# Patient Record
Sex: Female | Born: 1980 | Hispanic: Yes | Marital: Married | Smoking: Never smoker
Health system: Southern US, Community
[De-identification: ages and names within clinical notes are randomized; demographics above are authoritative.]

## PROBLEM LIST (undated history)

## (undated) HISTORY — PX: TUBAL LIGATION: SHX77

## (undated) HISTORY — PX: AUGMENTATION MAMMAPLASTY: SUR837

## (undated) HISTORY — PX: BREAST SURGERY: SHX581

---

## 2020-02-07 ENCOUNTER — Emergency Department (HOSPITAL_COMMUNITY): Payer: Self-pay

## 2020-02-07 ENCOUNTER — Other Ambulatory Visit: Payer: Self-pay

## 2020-02-07 ENCOUNTER — Emergency Department (HOSPITAL_COMMUNITY)
Admission: EM | Admit: 2020-02-07 | Discharge: 2020-02-07 | Disposition: A | Discharge status: Home / Self Care | Payer: Self-pay | Attending: Emergency Medicine | Admitting: Emergency Medicine

## 2020-02-07 DIAGNOSIS — R1084 Generalized abdominal pain: Secondary | ICD-10-CM

## 2020-02-07 DIAGNOSIS — R10817 Generalized abdominal tenderness: Secondary | ICD-10-CM | POA: Insufficient documentation

## 2020-02-07 DIAGNOSIS — M545 Low back pain, unspecified: Secondary | ICD-10-CM | POA: Insufficient documentation

## 2020-02-07 DIAGNOSIS — Z20822 Contact with and (suspected) exposure to covid-19: Secondary | ICD-10-CM | POA: Insufficient documentation

## 2020-02-07 LAB — URINALYSIS, ROUTINE W REFLEX MICROSCOPIC
Bilirubin Urine: NEGATIVE
Glucose, UA: NEGATIVE mg/dL
Hgb urine dipstick: NEGATIVE
Ketones, ur: NEGATIVE mg/dL
Leukocytes,Ua: NEGATIVE
Nitrite: NEGATIVE
Protein, ur: NEGATIVE mg/dL
Specific Gravity, Urine: 1.012 (ref 1.005–1.030)
pH: 6 (ref 5.0–8.0)

## 2020-02-07 LAB — CBC
HCT: 39.5 % (ref 36.0–46.0)
Hemoglobin: 13.1 g/dL (ref 12.0–15.0)
MCH: 32 pg (ref 26.0–34.0)
MCHC: 33.2 g/dL (ref 30.0–36.0)
MCV: 96.3 fL (ref 80.0–100.0)
Platelets: 227 10*3/uL (ref 150–400)
RBC: 4.1 MIL/uL (ref 3.87–5.11)
RDW: 11.4 % — ABNORMAL LOW (ref 11.5–15.5)
WBC: 3.2 10*3/uL — ABNORMAL LOW (ref 4.0–10.5)
nRBC: 0 % (ref 0.0–0.2)

## 2020-02-07 LAB — COMPREHENSIVE METABOLIC PANEL
ALT: 33 U/L (ref 0–44)
AST: 25 U/L (ref 15–41)
Albumin: 3.9 g/dL (ref 3.5–5.0)
Alkaline Phosphatase: 60 U/L (ref 38–126)
Anion gap: 10 (ref 5–15)
BUN: 11 mg/dL (ref 6–20)
CO2: 24 mmol/L (ref 22–32)
Calcium: 9.2 mg/dL (ref 8.9–10.3)
Chloride: 102 mmol/L (ref 98–111)
Creatinine, Ser: 0.55 mg/dL (ref 0.44–1.00)
GFR, Estimated: >60 mL/min (ref >60)
Glucose, Bld: 92 mg/dL (ref 70–99)
Potassium: 3.6 mmol/L (ref 3.5–5.1)
Sodium: 136 mmol/L (ref 135–145)
Total Bilirubin: 0.7 mg/dL (ref 0.3–1.2)
Total Protein: 7.1 g/dL (ref 6.5–8.1)

## 2020-02-07 LAB — I-STAT BETA HCG BLOOD, ED (MC, WL, AP ONLY): I-stat hCG, quantitative: <5 m[IU]/mL (ref <5)

## 2020-02-07 LAB — LIPASE, BLOOD: Lipase: 30 U/L (ref 11–51)

## 2020-02-07 MED ORDER — HYDROCODONE-ACETAMINOPHEN 5-325 MG PO TABS: 1.0000 | ORAL_TABLET | Freq: Four times a day (QID) | ORAL | 0 refills | Status: AC | PRN

## 2020-02-07 MED ORDER — IOHEXOL 300 MG/ML  SOLN
100.0000 mL | Freq: Once | INTRAMUSCULAR | Status: AC | PRN
  Administered 2020-02-07: 100 mL via INTRAVENOUS

## 2020-02-07 MED ORDER — KETOROLAC TROMETHAMINE 30 MG/ML IJ SOLN
30.0000 mg | Freq: Once | INTRAMUSCULAR | Status: AC
  Administered 2020-02-07: 30 mg via INTRAVENOUS
  Filled 2020-02-07: qty 1

## 2020-02-07 NOTE — ED Notes (Signed)
Xray waiting on COVID swab to come back before patient is transported to xray.

## 2020-02-07 NOTE — Discharge Instructions (Signed)
As we discussed, your lab work were reassuring.  Your CT scan showed some thickening of your uterus that could be consistent with a uterine fibroid. We will plan to have you follow-up with the outpatient OB/GYN doctors for further evaluation. This also may require a ultrasound for further management and description of these findings.  You can take Tylenol or Ibuprofen as directed for pain. You can alternate Tylenol and Ibuprofen every 4 hours. If you take Tylenol at 1pm, then you can take Ibuprofen at 5pm. Then you can take Tylenol again at 9pm.   Take pain medications as directed for break through pain. Do not drive or operate machinery while taking this medication.   Return to the emergency department for any fever, nausea/vomiting, worsening pain or any other worsening concerning symptoms.

## 2020-02-07 NOTE — ED Notes (Signed)
Pt to CT

## 2020-02-07 NOTE — ED Provider Notes (Signed)
[facility] EMERGENCY DEPARTMENT Provider Note   CSN: [id number] Arrival date & time: 02/07/20  0809     History Chief Complaint  Patient presents with   Abdominal Pain    Brandi Hurst is a 40 y.o. female who presents for evaluation of abdominal pain.  Patient reports that she has had left-sided abdominal pain for several months.  She states that she saw one clinic for evaluation of pain several months ago and was told to follow-up but she never did.  Patient states that she has continued to have abdominal pain.  She states that the abdominal pain is centrally located.  She states that sometimes it will hurt and be very sharp and other times it will be dull.  She states it comes randomly.  She states that there are certain foods to eat such as green vegetables, plan teens, Milks that does make it worse.  She has been able to eat and drink other foods without any difficulty.  She has not any associated nausea/vomiting.  She has not had any dysuria, hematuria.  She reports occasionally, she will get pain in her back as well.  She feels like sometimes the pain gets so severe it makes the pain in her back and leg.s  She states that the pain is worse when she bends down or moves. This does not happen all the time. She does report both the stomach pain and the back pain are worse around her menstrual cycle.   She has not taken any medication for this pain.  She states that about 3 months time, she felt like she has lost about 15 pounds.  She has not had any fevers or night sweats.  She denies any chest pain, difficulty breathing, numbness/weakness of her arms or legs.  The history is provided by the patient. A language interpreter was used.       No past medical history on file.  There are no problems to display for this patient.    The histories are not reviewed yet. Please review them in the "History" navigator section and refresh this [place].   OB History   No obstetric  history on file.     No family history on file.     Home Medications Prior to Admission medications   Medication Sig Start Date End Date Taking? Authorizing Provider  HYDROcodone-acetaminophen (NORCO/VICODIN) 5-325 MG tablet Take 1-2 tablets by mouth every 6 (six) hours as needed. 02/07/20  Yes Volanda Napoleon, PA-C    Allergies    Patient has no known allergies.  Review of Systems   Review of Systems  Constitutional: Negative for fever.  Respiratory: Negative for cough and shortness of breath.   Cardiovascular: Negative for chest pain.  Gastrointestinal: Positive for abdominal pain. Negative for nausea and vomiting.  Genitourinary: Negative for dysuria and hematuria.  Musculoskeletal: Positive for back pain.  Neurological: Negative for headaches.  All other systems reviewed and are negative.   Physical Exam Updated Vital Signs BP 113/69    Pulse (!) 59    Temp 98.2 F (36.8 C)    Resp 13    LMP 01/26/2020    SpO2 100%   Physical Exam Vitals and nursing note reviewed.  Constitutional:      Appearance: Normal appearance. She is well-developed and well-nourished.     Comments: Sitting comfortably on examination table  HENT:     Head: Normocephalic and atraumatic.     Mouth/Throat:     Mouth:  Oropharynx is clear and moist and mucous membranes are normal.  Eyes:     General: Lids are normal.     Extraocular Movements: EOM normal.     Conjunctiva/sclera: Conjunctivae normal.     Pupils: Pupils are equal, round, and reactive to light.  Cardiovascular:     Rate and Rhythm: Normal rate and regular rhythm.     Pulses: Normal pulses.     Heart sounds: Normal heart sounds. No murmur heard. No friction rub. No gallop.   Pulmonary:     Effort: Pulmonary effort is normal.     Breath sounds: Normal breath sounds.     Comments: Lungs clear to auscultation bilaterally.  Symmetric chest rise.  No wheezing, rales, rhonchi. Abdominal:     Palpations: Abdomen is soft. Abdomen is  not rigid.     Tenderness: There is generalized abdominal tenderness. There is no guarding.       Comments: Abdomen soft, nondistended.  Generalized tenderness noted centrally.  No rigidity, guarding.  Musculoskeletal:        General: Normal range of motion.     Cervical back: Full passive range of motion without pain.     Comments: Tenderness palpation in midline T-spine.  No deformity or step-off noted.  Skin:    General: Skin is warm and dry.     Capillary Refill: Capillary refill takes less than 2 seconds.  Neurological:     Mental Status: She is alert and oriented to person, place, and time.     Comments: Follows commands, Moves all extremities  5/5 strength to BUE and BLE  Sensation intact throughout all major nerve distributions  Psychiatric:        Mood and Affect: Mood and affect normal.        Speech: Speech normal.     ED Results / Procedures / Treatments   Labs (all labs ordered are listed, but only abnormal results are displayed) Labs Reviewed  CBC - Abnormal; Notable for the following components:      Result Value   WBC 3.2 (*)    RDW 11.4 (*)    All other components within normal limits  URINALYSIS, ROUTINE W REFLEX MICROSCOPIC - Abnormal; Notable for the following components:   APPearance HAZY (*)    All other components within normal limits  SARS CORONAVIRUS 2 (TAT 6-24 HRS)  LIPASE, BLOOD  COMPREHENSIVE METABOLIC PANEL  I-STAT BETA HCG BLOOD, ED ([facility], [facility], [facility] ONLY)    EKG None  Radiology CT ABDOMEN PELVIS W CONTRAST  Result Date: 02/07/2020 CLINICAL DATA:  Left-sided abdominal pain for 3 days. EXAM: CT ABDOMEN AND PELVIS WITH CONTRAST TECHNIQUE: Multidetector CT imaging of the abdomen and pelvis was performed using the standard protocol following bolus administration of intravenous contrast. CONTRAST:  122mL OMNIPAQUE IOHEXOL 300 MG/ML  SOLN COMPARISON:  None. FINDINGS: Lower Chest: No acute findings. Hepatobiliary: No hepatic masses identified.  Gallbladder is unremarkable. No evidence of biliary ductal dilatation. Pancreas:  No mass or inflammatory changes. Spleen: Within normal limits in size and appearance. Adrenals/Urinary Tract: No masses identified. No evidence of ureteral calculi or hydronephrosis. Stomach/Bowel: No evidence of obstruction, inflammatory process or abnormal fluid collections. Vascular/Lymphatic: No pathologically enlarged lymph nodes. No abdominal aortic aneurysm. Reproductive: Uterus with is retroflexed and mildly enlarged, with heterogeneous enhancement. This may be due to adenomyosis or uterine fibroid. Adnexal regions are unremarkable. Other:  None. Musculoskeletal:  No suspicious bone lesions identified. IMPRESSION: No acute findings. Mildly enlarged and heterogeneous enhancement of  uterus, which may be due to adenomyosis or fibroid. Pelvic ultrasound could be performed for further evaluation if clinically warranted. Electronically Signed   By: Marlaine Hind M.D.   On: 02/07/2020 19:48    Procedures Procedures (including critical care time)  Medications Ordered in ED Medications  iohexol (OMNIPAQUE) 300 MG/ML solution 100 mL (100 mLs Intravenous Contrast Given 02/07/20 1934)  ketorolac (TORADOL) 30 MG/ML injection 30 mg (30 mg Intravenous Given 02/07/20 2123)    ED Course  I have reviewed the triage vital signs and the nursing notes.  Pertinent labs & imaging results that were available during my care of the patient were reviewed by me and considered in my medical decision making (see chart for details).    MDM Rules/Calculators/A&P                          40 year old female who presents for evaluation of generalized abdominal pain. She reports this has been ongoing for several months. She sought evaluation for it and was referred but states that she never followed up with them. She comes in today because she continues to have pain. She also reports that occasionally, she will get pain in her back. She states  that sometimes the pain is worse when she bends over. She also reports certain foods such as milk, plan teens, green vegetables cause the pain to get worse. No fevers, nausea/vomiting. She does think she is lost about 15 pounds in the last several months. On initial arrival, she is afebrile, nontoxic-appearing. Vital signs are stable. On exam, she has generalized central abdominal tenderness. No rigidity, guarding. She also has some point midline tenderness and into the mid thoracic spine. Low suspicion for infectious process such as appendicitis, diverticulitis given that this is been ongoing. Additionally, history/physical exam exerting for ovarian torsion, PID. Low suspicion for GU etiology, hepatobiliary etiology. Plan to check labs. Given that this has been ongoing she does report some weight loss, will obtain imaging.  UA shows no infectious etiology. Lipase normal. CBC shows slight leukopenia at 3.2. I-STAT beta is negative. CMP is unremarkable.  CT abdomen shows no evidence of acute findings. There is mildly enlarged and heterogenous enhancement of the uterus which may be due to adenomyosis and fibroid. She will need a pelvic ultrasound on an outpatient basis. Adnexa unremarkable.   Reevaluation. Patient resting comfortably. Repeat abdominal exam is benign. She reports improvement in tenderness and pain after Toradol here in the department.  I discussed with her regarding her findings on CT scan. I discussed with her that her findings could be consistent with a fibroid vs adenomysosis. She does tell me that she has had heavy menstrual cycles. Patient does not have an OB/GYN she follows up. We will plan to give her outpatient referrals to get an outpatient ultrasound. Encourage at home supportive care measures. I did discuss with patient that she has a COVID test pending given leukopenia as well as consultation of symptoms. I also discussed with patient she had an x-ray of her T-spine pending given  that she had some tenderness noted. Patient states she does not wish to stay for the x-ray and would like to go home. I feel this is reasonable. Patient given short course of pain medication for severe breakthrough pain. Encouraged at home supportive care measures and encouraged follow-up with outpatient OB/GYN as directed. At this time, patient exhibits no emergent life-threatening condition that require further evaluation in ED. Patient had ample  opportunity for questions and discussion. All patient's questions were answered with full understanding. Strict return precautions discussed. Patient expresses understanding and agreement to plan.   Erleen Egner was evaluated in Emergency Department on 02/08/2020 for the symptoms described in the history of present illness. She was evaluated in the context of the global COVID-19 pandemic, which necessitated consideration that the patient might be at risk for infection with the SARS-CoV-2 virus that causes COVID-19. Institutional protocols and algorithms that pertain to the evaluation of patients at risk for COVID-19 are in a state of rapid change based on information released by regulatory bodies including the CDC and federal and state organizations. These policies and algorithms were followed during the patient's care in the ED.  Portions of this note were generated with Lobbyist. Dictation errors may occur despite best attempts at proofreading.    Final Clinical Impression(s) / ED Diagnoses Final diagnoses:  Generalized abdominal pain    Rx / DC Orders ED Discharge Orders         Ordered    HYDROcodone-acetaminophen (NORCO/VICODIN) 5-325 MG tablet  Every 6 hours PRN        02/07/20 2204           Volanda Napoleon, PA-C 02/08/20 1851    Charlesetta Shanks, MD 02/13/20 1625

## 2020-02-07 NOTE — ED Triage Notes (Signed)
Pt here with reports of L sided abdominal pain X3 days. States today the pain woke her up and is worse. Denies NVD. Denies sick contacts. Pt appears uncomfortable.

## 2020-02-07 NOTE — ED Notes (Signed)
Patient verbalizes understanding of discharge instructions. Opportunity for questioning and answers were provided. Armband removed by staff, pt discharged from ED ambulatory.   

## 2020-02-08 LAB — SARS CORONAVIRUS 2 (TAT 6-24 HRS): SARS Coronavirus 2: NEGATIVE

## 2020-04-09 ENCOUNTER — Encounter: Payer: Self-pay | Admitting: Obstetrics & Gynecology

## 2020-04-09 ENCOUNTER — Ambulatory Visit (INDEPENDENT_AMBULATORY_CARE_PROVIDER_SITE_OTHER): Payer: Self-pay | Admitting: Obstetrics & Gynecology

## 2020-04-09 ENCOUNTER — Other Ambulatory Visit (HOSPITAL_COMMUNITY)
Admission: RE | Admit: 2020-04-09 | Discharge: 2020-04-09 | Disposition: A | Discharge status: Home / Self Care | Payer: Self-pay | Source: Ambulatory Visit | Attending: Obstetrics & Gynecology | Admitting: Obstetrics & Gynecology

## 2020-04-09 ENCOUNTER — Other Ambulatory Visit: Payer: Self-pay

## 2020-04-09 VITALS — BP 123/78 | HR 67 | Wt 116.4 lb

## 2020-04-09 DIAGNOSIS — R102 Pelvic and perineal pain: Secondary | ICD-10-CM

## 2020-04-09 DIAGNOSIS — N946 Dysmenorrhea, unspecified: Secondary | ICD-10-CM

## 2020-04-09 DIAGNOSIS — N852 Hypertrophy of uterus: Secondary | ICD-10-CM

## 2020-04-09 NOTE — Patient Instructions (Addendum)
BCCCP (Breast and Cervical Cancer Control Program)  Call [phone] to schedule PAP smear.  Dismenorrea Dysmenorrhea La dismenorrea son los calambres durante el perodo (perodo menstrual) que causan dolor en la parte inferior del vientre (abdomen). La causa del dolor son los espasmos (contracciones) de los msculos del tero. El dolor puede ser leve o muy intenso. La dismenorrea primaria son los calambres que duran algunos das cuando una mujer comienza a Best boy perodos menstruales o poco despus. A medida que la mujer [employer], o tiene un beb, los clicos, generalmente, disminuyen o desaparecen. La dismenorrea secundaria aparece con la edad y es causada por otros problemas. Cules son las causas? Esta afeccin puede ser causada por problemas con:  El tejido que reviste el tero. Este tejido puede crecer: ? Fuera del tero. ? En las paredes del tero.  Los vasos sanguneos de la zona [employer] de la cadera (pelvis).  El tejido en la parte inferior del tero (cuello uterino), incluidos los crecimientos (plipos).  Los msculos que sostienen el tero.  La vejiga.  Los intestinos. Tambin puede se consecuencia del cncer. Otras causas son:  Baird Lyons muy inclinado.  Una pequea abertura en la parte inferior del tero.  Tumores en el tero que no son cncer.  Enfermedad inflamatoria plvica (EIP).  Cicatrices de cirugas a las que se ha sometido.  Un quiste en los ovarios.  Un dispositivo intrauterino (DIU). Qu incrementa el riesgo?  Tener menos de 30aos.  Haber tenido una pubertad temprana.  Tener sangrado irregular o abundante.  Nunca haber dado a luz.  Tener antecedentes familiares de calambres menstruales.  Fumar o usar productos con nicotina.  Tener un peso corporal alto o bajo. Cules son los signos o sntomas?  Dolor y Ecologist parte inferior del vientre o de la espalda.  Sensacin de plenitud en la parte inferior del  vientre.  Los perodos duran ms de 7 das.  Dolores de [place].  Distensin.  Cansancio (fatiga).  Sensacin de que va a vomitar (nuseas) o vomitar.  Materia fecal lquida (diarrea) o suelta (heces).  Sudoracin.  Mareos. Cmo se trata? El tratamiento depende de la causa de los calambres. El tratamiento puede incluir medicamentos, por ejemplo:  Analgsicos.  Medicamentos para el sangrado.  Terapia de reemplazo de sustancias qumicas del cuerpo (hormonas). ? Inyecciones para detener el perodo menstrual. ? Anticonceptivos orales. ? Un dispositivo intrauterino (DIU).  Antiinflamatorios no esteroideos (AINE), como el ibuprofeno. Otros tratamientos pueden incluir los siguientes:  Cirugas.  Procedimientos.  Estimulacin nerviosa.  Hacer ejercicios.  Yoga y tratamientos alternativos. Colabore con su mdico para encontrar los tratamientos que sean mejores para usted. Siga estas instrucciones en su casa: Aliviar el dolor y los calambres  Si se lo indican, aplique calor en la parte inferior de la espalda o el vientre cuando tenga dolor o calambres. Hgalo con la frecuencia que le haya indicado el mdico. Use la fuente de calor que el mdico le recomiende, como una compresa de calor hmedo o una almohadilla trmica. ? Coloque una [employer] piel y la fuente de Freight forwarder. ? Aplique calor durante 20 a 81minutos. ? Retire la fuente de calor si la piel se le pone de color rojo brillante. Esto es [employer]. Si no puede sentir dolor, calor o fro, tiene un mayor riesgo de [place].  No duerma con una almohadilla trmica.  Realizar actividad fsica. Caminar, nadar o andar en bicicleta pueden ayudar a eliminar los calambres.  Masajee la zona  inferior de la espalda o el vientre. Esto puede ayudar a Dietitian.   Instrucciones generales  Use los medicamentos de venta libre y los recetados solamente como se lo haya indicado el mdico.  Pregunte al mdico si debe  evitar conducir o Risk manager mquinas mientras toma los medicamentos.  Evite la ingesta de alcohol y cafena inmediatamente antes y durante el perodo menstrual. Esto puede empeorar los calambres.  No fume ni consuma ningn producto que contenga nicotina o tabaco. Si necesita ayuda para dejar de fumar, consulte al mdico.  Cumpla con todas las visitas de seguimiento. Comunquese con un mdico si:  Su dolor empeora.  Su dolor no se alivia con medicamentos.  Siente dolor durante las [employer].  Siente ganas de vomitar o vomita durante el perodo menstrual y los medicamentos no [place]. Solicite ayuda de inmediato si:  Se desmaya. Resumen  La dismenorrea son los calambres dolorosos durante el perodo menstrual.  Aplique calor en la parte inferior de la espalda o el vientre cuando tiene dolor o calambres.  Haga ejercicios aerbicos como caminar, nadar o Catering manager.  Comunquese con un mdico si siente dolor durante la relacin sexual. Esta informacin no tiene Marine scientist el consejo del mdico. Asegrese de hacerle al mdico cualquier pregunta que tenga. Document Revised: 11/12/2019 Document Reviewed: 11/12/2019 Elsevier Patient Education  2021 [employer].

## 2020-04-09 NOTE — Progress Notes (Signed)
Patient ID: Brandi Hurst, female   DOB: May 03, 1980, 40 y.o.   MRN: [id number]  Chief Complaint  Patient presents with  . Abdominal Pain    HPI Brandi Hurst is a 40 y.o. female.  [id number] She was seen 02/13/20 in ED with several months of LLQ pain and notes heavy menses as well HPI  History reviewed. No pertinent past medical history.  Past Surgical History:  Procedure Laterality Date  . BREAST SURGERY     cosmetic/implants   . TUBAL LIGATION      Family History  Problem Relation Age of Onset  . Hypertension Mother   . Heart attack Father   . Heart attack Brother     Social History Social History   Tobacco Use  . Smoking status: Never Smoker  . Smokeless tobacco: Never Used  Vaping Use  . Vaping Use: Never used  Substance Use Topics  . Alcohol use: Yes    Comment: occ  . Drug use: Never    No Known Allergies  Current Outpatient Medications  Medication Sig Dispense Refill  . Multiple Vitamins-Minerals (MULTI ADULT GUMMIES) CHEW Chew by mouth.    Marland Kitchen HYDROcodone-acetaminophen (NORCO/VICODIN) 5-325 MG tablet Take 1-2 tablets by mouth every 6 (six) hours as needed. (Patient not taking: Reported on 04/09/2020) 6 tablet 0   No current facility-administered medications for this visit.    Review of Systems Review of Systems  Constitutional: Negative.   Gastrointestinal: Positive for abdominal distention (pressure when she bends at the waist) and abdominal pain.  Genitourinary: Positive for menstrual problem and pelvic pain (dysmenorrhea). Negative for difficulty urinating, dyspareunia, frequency, hematuria, vaginal bleeding and vaginal discharge.    Blood pressure 123/78, pulse 67, weight 116 lb 6.4 oz (52.8 kg), last menstrual period 03/17/2020.  Physical Exam Physical Exam Vitals and nursing note reviewed. Exam conducted with a chaperone present.  Constitutional:      Appearance: She is well-developed. She is ill-appearing.  Pulmonary:     Effort: Pulmonary effort  is normal.  Abdominal:     General: Abdomen is flat. There is no distension.     Palpations: Abdomen is soft.     Tenderness: There is no abdominal tenderness.  Genitourinary:    General: Normal vulva.     Exam position: Lithotomy position.     Vagina: Normal.     Cervix: Normal.     Uterus: Deviated (retroflexed) and enlarged (4-6 weeks). Not tender.      Adnexa: Right adnexa normal and left adnexa normal.  Neurological:     Mental Status: She is alert.     Data Reviewed  Narrative & Impression  CLINICAL DATA:  Left-sided abdominal pain for 3 days.  EXAM: CT ABDOMEN AND PELVIS WITH CONTRAST  TECHNIQUE: Multidetector CT imaging of the abdomen and pelvis was performed using the standard protocol following bolus administration of intravenous contrast.  CONTRAST:  163mL OMNIPAQUE IOHEXOL 300 MG/ML  SOLN  COMPARISON:  None.  FINDINGS: Lower Chest: No acute findings.  Hepatobiliary: No hepatic masses identified. Gallbladder is unremarkable. No evidence of biliary ductal dilatation.  Pancreas:  No mass or inflammatory changes.  Spleen: Within normal limits in size and appearance.  Adrenals/Urinary Tract: No masses identified. No evidence of ureteral calculi or hydronephrosis.  Stomach/Bowel: No evidence of obstruction, inflammatory process or abnormal fluid collections.  Vascular/Lymphatic: No pathologically enlarged lymph nodes. No abdominal aortic aneurysm.  Reproductive: Uterus with is retroflexed and mildly enlarged, with heterogeneous enhancement. This may be due to adenomyosis or  uterine fibroid. Adnexal regions are unremarkable.  Other:  None.  Musculoskeletal:  No suspicious bone lesions identified.  IMPRESSION: No acute findings.  Mildly enlarged and heterogeneous enhancement of uterus, which may be due to adenomyosis or fibroid. Pelvic ultrasound could be performed for further evaluation if clinically  warranted.   Electronically Signed   By: Marlaine Hind M.D.   On: 02/07/2020 19:48    Assessment Uterine enlargement - Plan: US PELVIC COMPLETE WITH TRANSVAGINAL  Dysmenorrhea - Plan: US PELVIC COMPLETE WITH TRANSVAGINAL  Pelvic pain - Plan: Cervicovaginal ancillary only    Plan Orders Placed This Encounter  Procedures  . US PELVIC COMPLETE WITH TRANSVAGINAL    Standing Status:   Future    Number of Occurrences:   1    Standing Expiration Date:   04/09/2021    Order Specific Question:   Reason for Exam (SYMPTOM  OR DIAGNOSIS REQUIRED)    Answer:   uterine enlargement    Order Specific Question:   Preferred imaging location?    Answer:   [facility]    Order Specific Question:   Release to patient    Answer:   Immediate   F/u after the [place]    James Arnold 04/09/2020, 4:06 PM

## 2020-04-10 LAB — CERVICOVAGINAL ANCILLARY ONLY
Bacterial Vaginitis (gardnerella): NEGATIVE
Candida Glabrata: NEGATIVE
Candida Vaginitis: NEGATIVE
Chlamydia: NEGATIVE
Comment: NEGATIVE
Comment: NEGATIVE
Comment: NEGATIVE
Comment: NEGATIVE
Comment: NEGATIVE
Comment: NORMAL
Neisseria Gonorrhea: NEGATIVE
Trichomonas: NEGATIVE

## 2020-04-15 ENCOUNTER — Ambulatory Visit
Admission: RE | Admit: 2020-04-15 | Discharge: 2020-04-15 | Disposition: A | Discharge status: Home / Self Care | Payer: Self-pay | Source: Ambulatory Visit | Attending: Obstetrics & Gynecology | Admitting: Obstetrics & Gynecology

## 2020-04-15 ENCOUNTER — Other Ambulatory Visit: Payer: Self-pay

## 2020-04-15 DIAGNOSIS — N946 Dysmenorrhea, unspecified: Secondary | ICD-10-CM | POA: Insufficient documentation

## 2020-04-15 DIAGNOSIS — N852 Hypertrophy of uterus: Secondary | ICD-10-CM | POA: Insufficient documentation

## 2020-04-20 DIAGNOSIS — N852 Hypertrophy of uterus: Secondary | ICD-10-CM | POA: Insufficient documentation

## 2020-04-20 DIAGNOSIS — R102 Pelvic and perineal pain: Secondary | ICD-10-CM | POA: Insufficient documentation

## 2020-04-20 DIAGNOSIS — N946 Dysmenorrhea, unspecified: Secondary | ICD-10-CM | POA: Insufficient documentation

## 2020-05-28 ENCOUNTER — Ambulatory Visit: Payer: Self-pay | Admitting: Obstetrics & Gynecology

## 2020-06-11 ENCOUNTER — Encounter: Payer: Self-pay | Admitting: Obstetrics and Gynecology

## 2020-06-11 ENCOUNTER — Ambulatory Visit (INDEPENDENT_AMBULATORY_CARE_PROVIDER_SITE_OTHER): Payer: Self-pay | Admitting: Obstetrics and Gynecology

## 2020-06-11 ENCOUNTER — Other Ambulatory Visit: Payer: Self-pay

## 2020-06-11 VITALS — BP 122/60 | HR 63 | Wt 114.4 lb

## 2020-06-11 DIAGNOSIS — N946 Dysmenorrhea, unspecified: Secondary | ICD-10-CM

## 2020-06-11 DIAGNOSIS — Z758 Other problems related to medical facilities and other health care: Secondary | ICD-10-CM

## 2020-06-11 DIAGNOSIS — Z789 Other specified health status: Secondary | ICD-10-CM

## 2020-06-11 DIAGNOSIS — Z124 Encounter for screening for malignant neoplasm of cervix: Secondary | ICD-10-CM

## 2020-06-11 DIAGNOSIS — R102 Pelvic and perineal pain unspecified side: Secondary | ICD-10-CM

## 2020-06-11 DIAGNOSIS — D259 Leiomyoma of uterus, unspecified: Secondary | ICD-10-CM

## 2020-06-11 DIAGNOSIS — N920 Excessive and frequent menstruation with regular cycle: Secondary | ICD-10-CM

## 2020-06-11 MED ORDER — NORGESTIMATE-ETH ESTRADIOL 0.25-35 MG-MCG PO TABS: 1.0000 | ORAL_TABLET | Freq: Every day | ORAL | 3 refills | Status: DC

## 2020-06-11 MED ORDER — IBUPROFEN 600 MG PO TABS: ORAL_TABLET | ORAL | 3 refills | Status: DC

## 2020-06-11 NOTE — Progress Notes (Signed)
Pt reports PAP smear completed November 2019. 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

## 2020-06-11 NOTE — Progress Notes (Signed)
Obstetrics and Gynecology Established Patient Evaluation  Appointment Date: 06/11/2020  OBGYN Clinic: [facility]   Primary Care Provider: [facility]  Referring Provider: No ref. provider found  Chief Complaint:  Chief Complaint  Patient presents with  . Results    History of Present Illness: Brandi Hurst is a 40 y.o. Hispanic [phone] (Patient's last menstrual period was 06/03/2020 (exact date).), seen for the above chief complaint. Her past medical history is significant for h/o BTL  Patient initially seen by Dr. Roselie Awkward on 3/10 for ED f/u for abdominal pain. In the ED they dx her w/ fibroids. At his visit, he ordered an u/s. U/s a week later showed  6cm IM to ?submucosal fibroid and overall uterine size of 10.6cm (see below).  Patient states she started having the abdominal/low pelvic discomfort for a long time before going to the ED but could barely stand up on that day.  She has never tried anything to help with her periods. +constipation  Review of Systems: Pertinent items noted in HPI and remainder of comprehensive ROS otherwise negative.    Patient Active Problem List   Diagnosis Date Noted  . Fibroid uterus 06/11/2020  . Uterine enlargement 04/20/2020  . Dysmenorrhea 04/20/2020  . Pelvic pain 04/20/2020   Past Medical History:  No past medical history on file.  Past Surgical History:  Past Surgical History:  Procedure Laterality Date  . BREAST SURGERY     cosmetic/implants   . TUBAL LIGATION      Past Obstetrical History:  OB History  Gravida Para Term Preterm AB Living  2 2 2     2   SAB IAB Ectopic Multiple Live Births          2    # Outcome Date GA Lbr Len/2nd Weight Sex Delivery Anes PTL Lv  2 Term     F Vag-Spont   LIV  1 Term     M Vag-Spont   LIV    Past Gynecological History: As per HPI. Periods: qmonth, regular, 3-7, heavy and painful History of Pap Smear(s): unsure STD testing: negative  march 2022  Social History:  Social History   Socioeconomic History  . Marital status: Married    Spouse name: Not on file  . Number of children: Not on file  . Years of education: Not on file  . Highest education level: Not on file  Occupational History  . Not on file  Tobacco Use  . Smoking status: Never Smoker  . Smokeless tobacco: Never Used  Vaping Use  . Vaping Use: Never used  Substance and Sexual Activity  . Alcohol use: Yes    Comment: occ  . Drug use: Never  . Sexual activity: Yes    Birth control/protection: None  Other Topics Concern  . Not on file  Social History Narrative  . Not on file   Social Determinants of Health   Financial Resource Strain: Not on file  Food Insecurity: No Food Insecurity  . Worried About Charity fundraiser in the Last Year: Never true  . Ran Out of Food in the Last Year: Never true  Transportation Needs: No Transportation Needs  . Lack of Transportation (Medical): No  . Lack of Transportation (Non-Medical): No  Physical Activity: Not on file  Stress: Not on file  Social Connections: Not on file  Intimate Partner Violence: Not on file    Family History:  Family History  Problem Relation Age of Onset  .  Hypertension Mother   . Heart attack Father   . Heart attack Brother     Medications Jerral Ralph had no medications administered during this visit. Current Outpatient Medications  Medication Sig Dispense Refill  . Acetaminophen (TYLENOL PO) Take by mouth.    . Multiple Vitamins-Minerals (MULTI ADULT GUMMIES) CHEW Chew by mouth. (Patient not taking: Reported on 06/11/2020)     No current facility-administered medications for this visit.    Allergies Patient has no known allergies.   Physical Exam:  BP 122/60   Pulse 63   Wt 114 lb 6.4 oz (51.9 kg)   LMP 06/03/2020 (Exact Date)  There is no height or weight on file to calculate BMI.  General appearance: Well nourished, well developed female in no acute distress.   CV: normal s1 and s2, no MRGs Respiratory:  CTAB. Normal respiratory effort Abdomen: +BS, nttp, nd Neuro/Psych:  Normal mood and affect.  Skin:  Warm and dry.   Laboratory: as per HPI  Radiology: Narrative & Impression  CLINICAL DATA:  Uterine enlargement, dysmenorrhea, LMP 04/12/2020  EXAM: TRANSABDOMINAL AND TRANSVAGINAL ULTRASOUND OF PELVIS  TECHNIQUE: Both transabdominal and transvaginal ultrasound examinations of the pelvis were performed. Transabdominal technique was performed for global imaging of the pelvis including uterus, ovaries, adnexal regions, and pelvic cul-de-sac. It was necessary to proceed with endovaginal exam following the transabdominal exam to visualize the endometrium and ovaries.  COMPARISON:  None; correlation CT abdomen and pelvis 02/07/2020  FINDINGS: Uterus  Measurements: 10.6 x 5.2 x 7.5 cm = volume: 218 mL. Retroflexed. Heterogeneous myometrium. Anterior wall transmural mass seen at upper uterus 5.9 cm greatest size consistent with leiomyoma. Additional 3.3 cm diameter subserosal mass anterior uterus.  Endometrium  Thickness: 7 mm. Small amount of nonspecific endometrial fluid. No focal mass.  Right ovary  Measurements: 2.7 x 2.0 x 2.2 cm = volume: 6.1 mL. Normal morphology without mass  Left ovary  Measurements: 3.3 x 1.9 x 1.6 cm = volume: 5.1 mL. Normal morphology without mass  Other findings  No free pelvic fluid.  No adnexal masses.  IMPRESSION: Two uterine leiomyomata identified, largest 5.9 cm in greatest size at anterior upper uterus which extends transmural to the submucosal region.  Small amount of nonspecific endometrial fluid.  Unremarkable ovaries and adnexa.   Electronically Signed   By: Lavonia Dana M.D.   On: 04/15/2020 16:37   Narrative & Impression  CLINICAL DATA:  Left-sided abdominal pain for 3 days.  EXAM: CT ABDOMEN AND PELVIS WITH CONTRAST  TECHNIQUE: Multidetector CT  imaging of the abdomen and pelvis was performed using the standard protocol following bolus administration of intravenous contrast.  CONTRAST:  123mL OMNIPAQUE IOHEXOL 300 MG/ML  SOLN  COMPARISON:  None.  FINDINGS: Lower Chest: No acute findings.  Hepatobiliary: No hepatic masses identified. Gallbladder is unremarkable. No evidence of biliary ductal dilatation.  Pancreas:  No mass or inflammatory changes.  Spleen: Within normal limits in size and appearance.  Adrenals/Urinary Tract: No masses identified. No evidence of ureteral calculi or hydronephrosis.  Stomach/Bowel: No evidence of obstruction, inflammatory process or abnormal fluid collections.  Vascular/Lymphatic: No pathologically enlarged lymph nodes. No abdominal aortic aneurysm.  Reproductive: Uterus with is retroflexed and mildly enlarged, with heterogeneous enhancement. This may be due to adenomyosis or uterine fibroid. Adnexal regions are unremarkable.  Other:  None.  Musculoskeletal:  No suspicious bone lesions identified.  IMPRESSION: No acute findings.  Mildly enlarged and heterogeneous enhancement of uterus, which may be due to adenomyosis or  fibroid. Pelvic ultrasound could be performed for further evaluation if clinically warranted.   Electronically Signed   By: Marlaine Hind M.D.   On: 02/07/2020 19:48    Assessment: pt stable  Plan:  1. Cervical cancer screening Patient does not have insurance. BCCCP information given.   2. Uterine leiomyoma, unspecified location Ultrasound images reviewed. Uterus is also very retroflexed.   I d/w her that fibroids are common and don't lead to cancer and usually no s/s but most common ones are bleeding, painful periods and pelvic pain.  I told her I recommend medical management firs and w/o insurance best option is OCPs; she does not smoke and denies any h/o blood clots or migraines. R/B d/w her and she is amenable to OCPs and told she can  start any time  I also recommended NSAID therapy x 3 days starting on first day of period for her. I told her to do motrin 600 q6h x 3d starting on first day of her cycle  3. Dysmenorrhea  4. Pelvic pain  5. Menorrhagia with regular cycle  No orders of the defined types were placed in this encounter.  Interpreter used  RTC 14m   Aletha Halim, Brooke Bonito MD Attending Center for [employer] manager)

## 2020-07-29 ENCOUNTER — Other Ambulatory Visit: Payer: Self-pay | Admitting: *Deleted

## 2020-07-29 ENCOUNTER — Ambulatory Visit: Payer: Self-pay

## 2020-07-29 ENCOUNTER — Other Ambulatory Visit: Payer: Self-pay

## 2020-07-29 DIAGNOSIS — Z124 Encounter for screening for malignant neoplasm of cervix: Secondary | ICD-10-CM

## 2020-07-29 NOTE — Progress Notes (Signed)
Patient: Brandi Hurst           Date of Birth: July 23, 1980           MRN: [id number] Visit Date: 07/29/2020 PCP: Patient, No Pcp Per (Inactive)  Cervical Cancer Screening Do you smoke?: No Have you ever had or been told you have an allergy to latex products?: No Marital status: Divorce Date of last pap smear: 2-5 yrs ago (2019 [facility]) Date of last menstrual period: 06/24/20 Number of pregnancies: 2 Number of births: 2 Have you ever had any of the following? Hysterectomy: No Tubal ligation (tubes tied): Yes Abnormal bleeding: Yes (currently taking BCPs, dx- fibroids) Abnormal pap smear: Yes (2013- dysplasia, LEEP) Venereal warts: No A sex partner with venereal warts: No A high risk* sex partner: No  Cervical Exam  Abnormal Observations: Normal Exam. Recommendations: Last Pap smear was 12/26/2017 at the [facility] and normal per Gaynell Face, RN at the [facility]. Per patient has history of an abnormal Pap smear in 2013 that a colposcopy and LEEP were completed for follow-up. Patient stated she has had at least three normal Pap smears since LEEP. No Pap smear results are in Epic. Let patient know if today's Pap smear is normal and HPV negative that her next Pap smear will be due 3-5 years. Informed patient that will call her with results to her Pap smear within the next couple of weeks. Patient verbalized understanding.   Used Spanish interpreter [employer] from [place].  Patient's History Patient Active Problem List   Diagnosis Date Noted   Fibroid uterus 06/11/2020   Uterine enlargement 04/20/2020   Dysmenorrhea 04/20/2020   Pelvic pain 04/20/2020   No past medical history on file.  Family History  Problem Relation Age of Onset   Hypertension Mother    Heart attack Father    Heart attack Brother     Social History   Occupational History   Not on file  Tobacco Use   Smoking status: Never   Smokeless tobacco: Never   Vaping Use   Vaping Use: Never used  Substance and Sexual Activity   Alcohol use: Yes    Comment: occ   Drug use: Never   Sexual activity: Yes    Birth control/protection: None

## 2020-08-05 ENCOUNTER — Other Ambulatory Visit: Payer: Self-pay | Admitting: Obstetrics and Gynecology

## 2020-08-05 DIAGNOSIS — Z1231 Encounter for screening mammogram for malignant neoplasm of breast: Secondary | ICD-10-CM

## 2020-08-06 LAB — CYTOLOGY - PAP
Comment: NEGATIVE
Diagnosis: UNDETERMINED — AB
High risk HPV: NEGATIVE

## 2020-08-07 ENCOUNTER — Telehealth: Payer: Self-pay

## 2020-08-07 NOTE — Telephone Encounter (Signed)
Attempted to call patient via [place] [phone] to give pap smear results. Left message with name and number to call back.

## 2020-08-07 NOTE — Progress Notes (Signed)
Repeat cytology and hpv testing in 3 years is recommended. thanks

## 2020-08-10 ENCOUNTER — Telehealth: Payer: Self-pay

## 2020-08-10 NOTE — Telephone Encounter (Signed)
Called patient via Brandi Hurst, [employer] to give pap results. Informed patient that pap was (ASCUS, - HPV) and per Dr. Ilda Basset next pap smear will be due in 3 years. Answered any questions that patient had regarding results. Patient voiced understanding.

## 2020-09-10 ENCOUNTER — Ambulatory Visit
Admission: RE | Admit: 2020-09-10 | Discharge: 2020-09-10 | Disposition: A | Discharge status: Home / Self Care | Payer: No Typology Code available for payment source | Source: Ambulatory Visit | Attending: Obstetrics and Gynecology | Admitting: Obstetrics and Gynecology

## 2020-09-10 ENCOUNTER — Other Ambulatory Visit: Payer: Self-pay

## 2020-09-10 ENCOUNTER — Ambulatory Visit: Payer: Self-pay | Admitting: *Deleted

## 2020-09-10 VITALS — BP 112/72 | Wt 114.6 lb

## 2020-09-10 DIAGNOSIS — Z1239 Encounter for other screening for malignant neoplasm of breast: Secondary | ICD-10-CM

## 2020-09-10 DIAGNOSIS — Z1231 Encounter for screening mammogram for malignant neoplasm of breast: Secondary | ICD-10-CM

## 2020-09-10 NOTE — Patient Instructions (Signed)
Explained breast self awareness with Jerral Ralph. Patient did not need a Pap smear today due to last Pap smear was 07/29/2020. Let her know her next Pap smear is due in 3 years per ASCCP guidelines and Dr. Ilda Basset. Referred patient to the [facility] for a screening mammogram on mobile unit. Appointment scheduled Thursday, September 10, 2020 at 1430. Patient escorted to the mobile unit following BCCCP appointment for her screening mammogram. Let patient know the Breast Center will follow up with her within the next couple weeks with results of her mammogram by letter or phone. Jerral Ralph verbalized understanding.  Natonya Finstad, Arvil Chaco, RN 2:10 PM

## 2020-09-10 NOTE — Progress Notes (Signed)
Ms. Flo Pignotti is a 40 y.o. female who presents to [facility] clinic today with no complaints.    Pap Smear: Pap smear not completed today. Last Pap smear was 07/29/2020 at [facility] and was abnormal - ASCUS with negative HPV . Per patient has history of an abnormal Pap smear in 2013 that a colposcopy and LEEP were completed for follow-up. Last Pap smear result is available in Epic.   Physical exam: Breasts Breasts symmetrical. No skin abnormalities bilateral breasts. No nipple retraction bilateral breasts. No nipple discharge bilateral breasts. No lymphadenopathy. No lumps palpated bilateral breasts. No complaints of pain or tenderness on exam. Patient has bilateral breast implants.   Pelvic/Bimanual Pap is not indicated today per BCCCP guidelines.   Smoking History: Patient has never smoked.   Patient Navigation: Patient education provided. Access to services provided for patient through [place] program. Spanish interpreter Rudene Anda from [facility] provided. Patient has food insecurities. Patient escorted to the food market at the [facility] for [employer] for groceries.   Breast and Cervical Cancer Risk Assessment: Patient does not have family history of breast cancer, known genetic mutations, or radiation treatment to the chest before age 79. Per patient has history of cervical dysplasia. Patient has no history of being immunocompromised or DES exposure in-utero.  Risk Assessment     Risk Scores       09/10/2020   Last edited by: Royston Bake, CMA   5-year risk: 0.3 %   Lifetime risk: 5.8 %            A: BCCCP exam without pap smear No complaints.  P: Referred patient to the [facility] for a screening mammogram on mobile unit. Appointment scheduled Thursday, September 10, 2020 at 1430.  Loletta Parish, RN 09/10/2020 2:10 PM

## 2021-04-13 ENCOUNTER — Other Ambulatory Visit: Payer: Self-pay | Admitting: Obstetrics and Gynecology

## 2021-04-26 ENCOUNTER — Other Ambulatory Visit: Payer: Self-pay | Admitting: *Deleted

## 2021-04-26 DIAGNOSIS — D259 Leiomyoma of uterus, unspecified: Secondary | ICD-10-CM

## 2021-04-26 MED ORDER — NORGESTIMATE-ETH ESTRADIOL 0.25-35 MG-MCG PO TABS: 1.0000 | ORAL_TABLET | Freq: Every day | ORAL | 2 refills | Status: DC

## 2021-04-26 NOTE — Progress Notes (Signed)
Fax refill request for birth control (Norgestimate) received from pharmacy. Per Dr. Ilda Basset, refill authorized x9 months and he stated that pt will need Annual Gyn exam after August. Called pt w/[employer] interpreter [phone]. She was informed that Rx refill has been sent to her pharmacy. She was also informed of need for office appointment after August and will need to call for appointment in July. Pt voiced understanding.  ?

## 2021-08-09 ENCOUNTER — Other Ambulatory Visit: Payer: Self-pay

## 2021-08-09 DIAGNOSIS — Z1231 Encounter for screening mammogram for malignant neoplasm of breast: Secondary | ICD-10-CM

## 2021-08-18 IMAGING — US US PELVIS COMPLETE WITH TRANSVAGINAL
1 series · 15 of 25 positions shown · non-contrast
Comparison: None; correlation CT abdomen and pelvis 02/07/2020

CLINICAL DATA: Uterine enlargement, dysmenorrhea, LMP 04/12/2020

EXAM:
TRANSABDOMINAL AND TRANSVAGINAL ULTRASOUND OF PELVIS
TECHNIQUE: Both transabdominal and transvaginal ultrasound examinations of the
pelvis were performed. Transabdominal technique was performed for
global imaging of the pelvis including uterus, ovaries, adnexal
regions, and pelvic cul-de-sac. It was necessary to proceed with
endovaginal exam following the transabdominal exam to visualize the
endometrium and ovaries.

[Series 1: us pelvis complete with transvaginal · 15 of 104 slices shown]
[im 1/104]
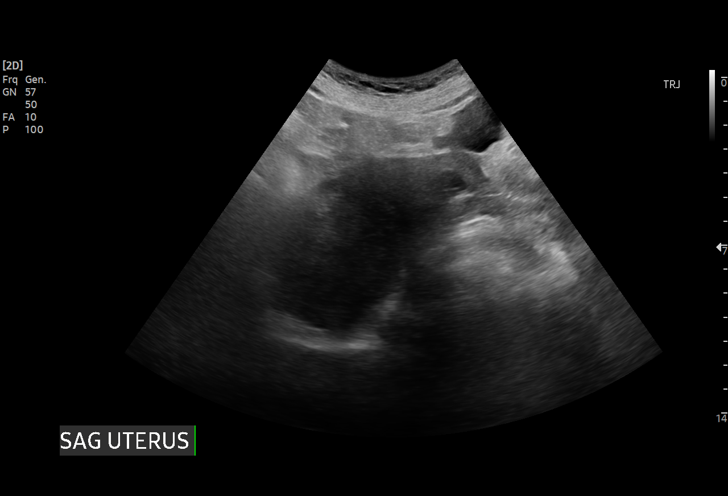
[im 9/104]
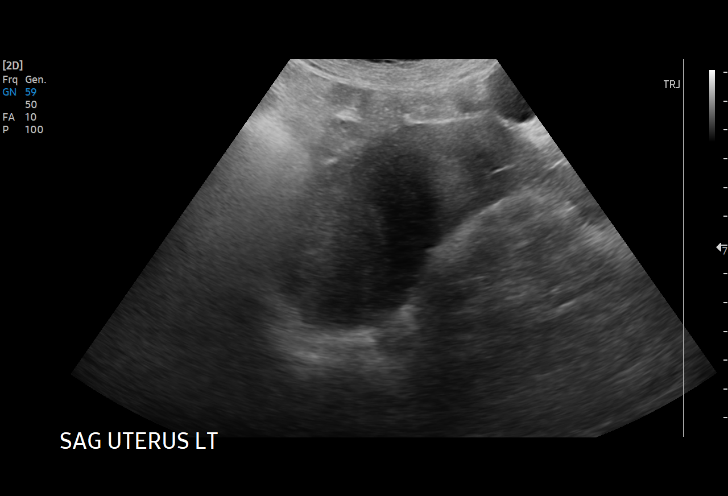
[im 18/104]
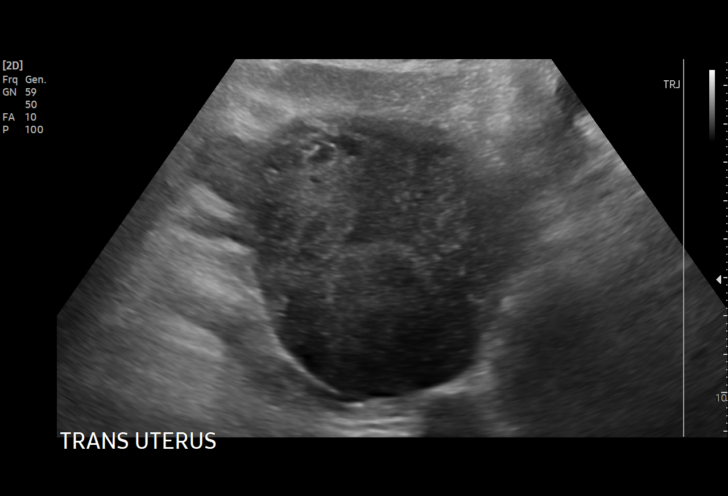
[im 22/104]
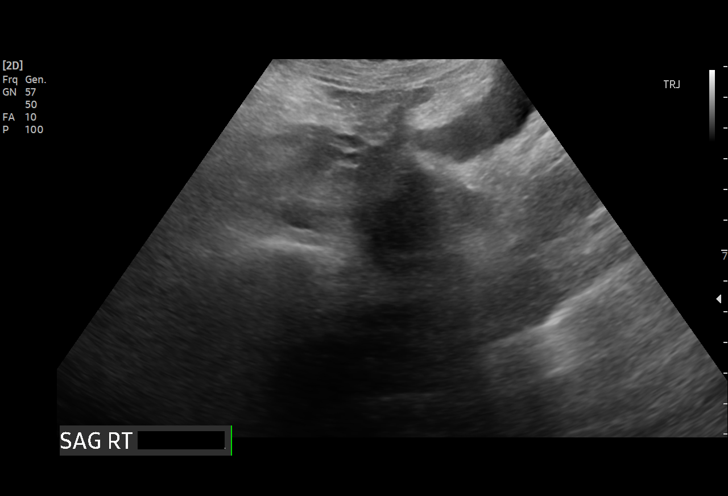
[im 31/104]
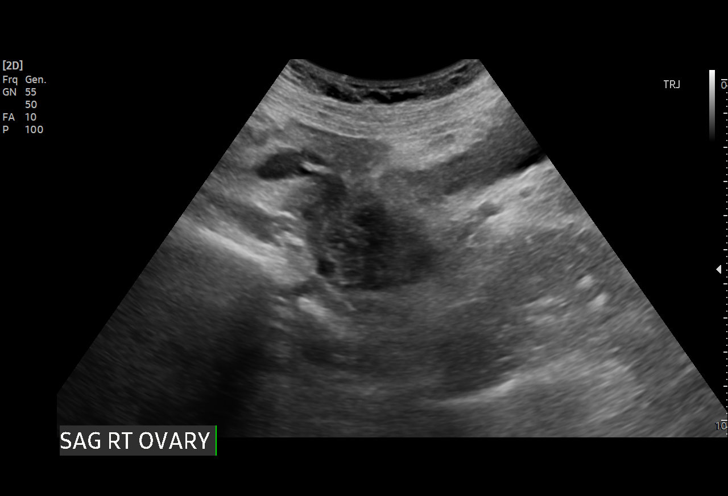
[im 39/104]
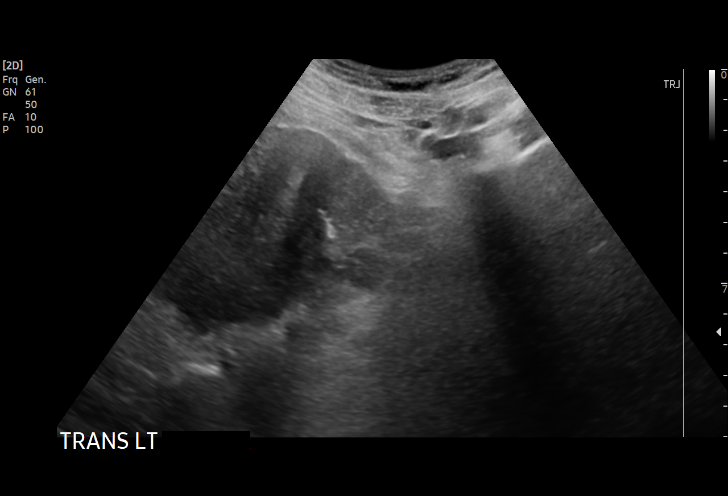
[im 43/104]
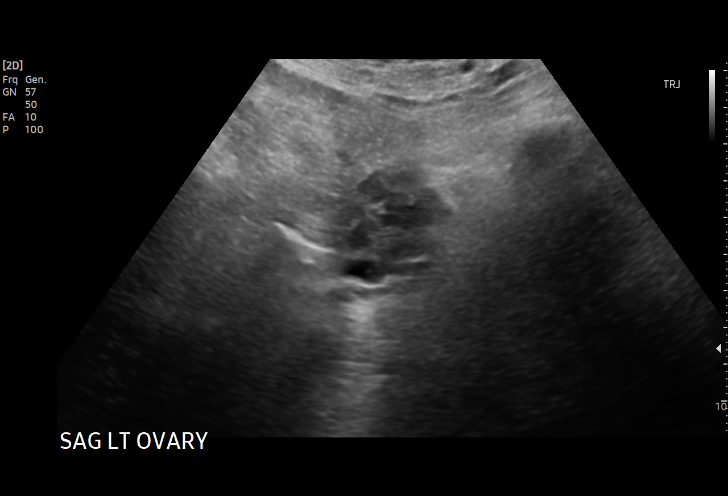
[im 52/104]
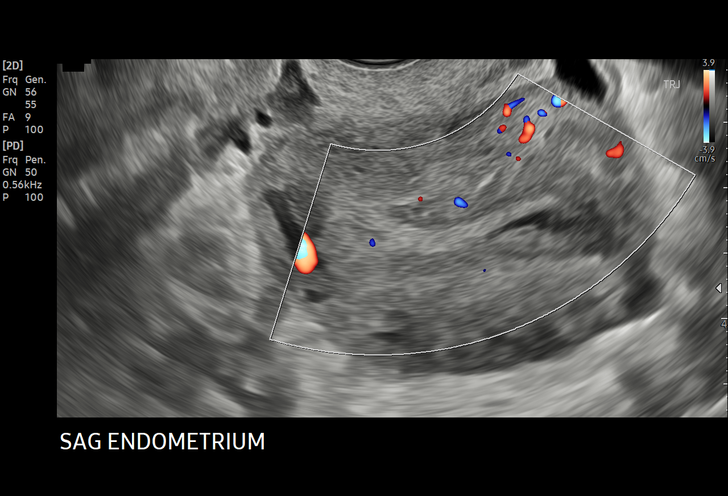
[im 61/104]
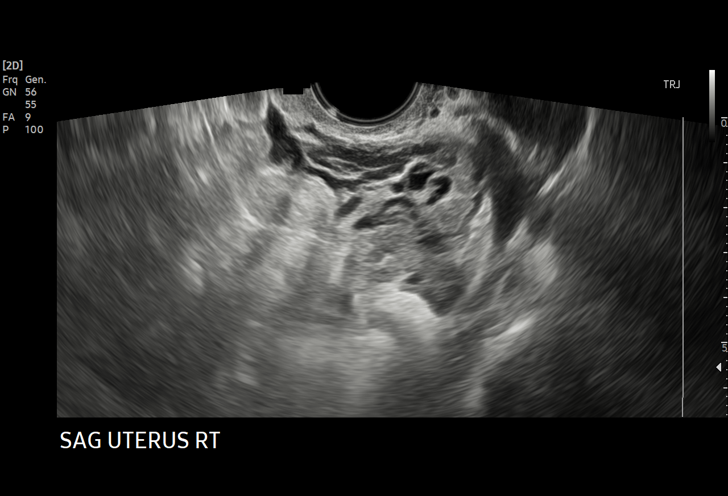
[im 65/104]
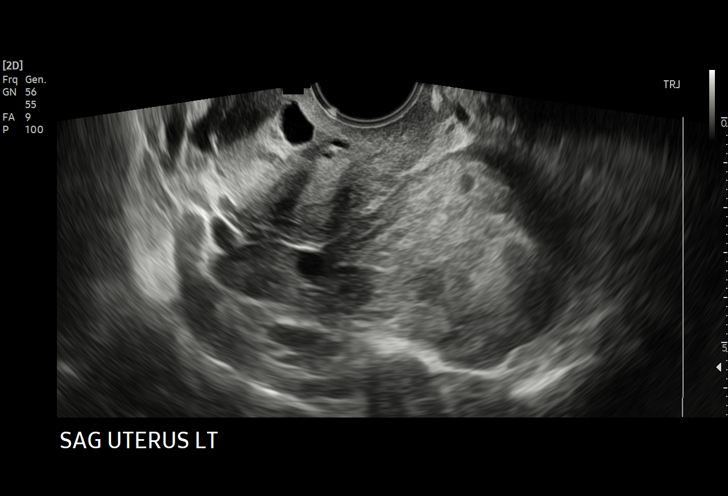
[im 73/104]
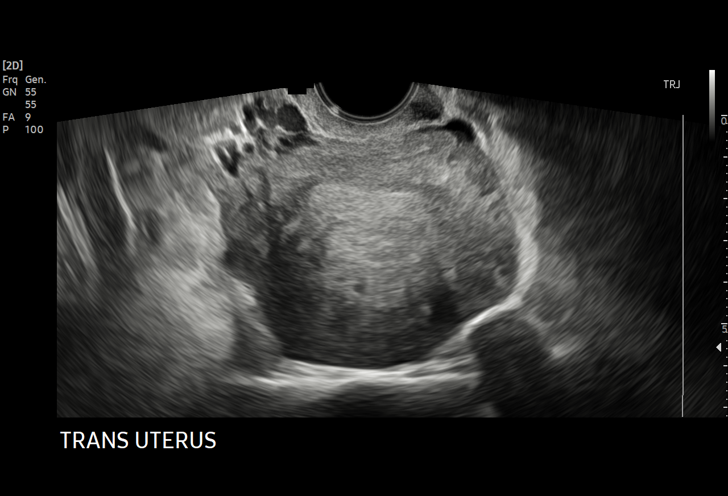
[im 82/104]
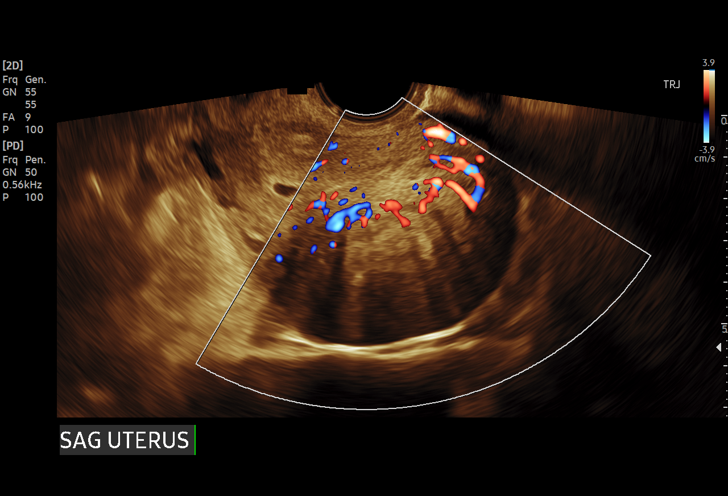
[im 86/104]
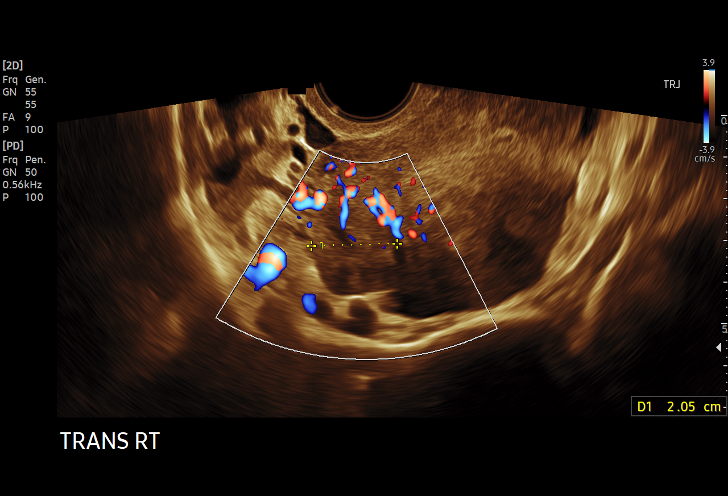
[im 95/104]
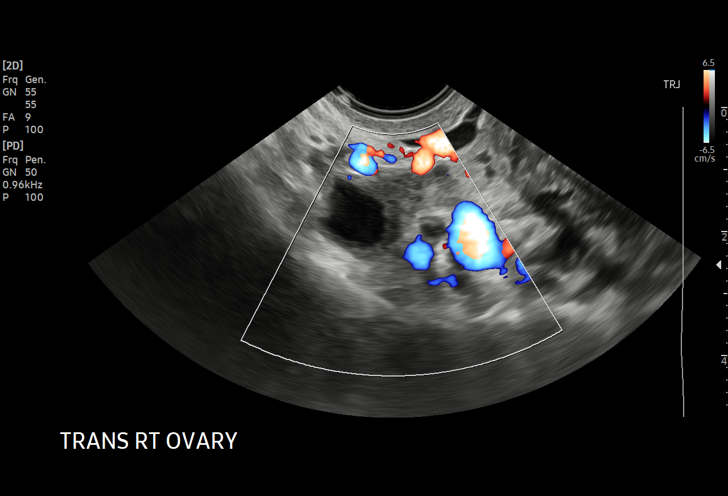
[im 104/104]
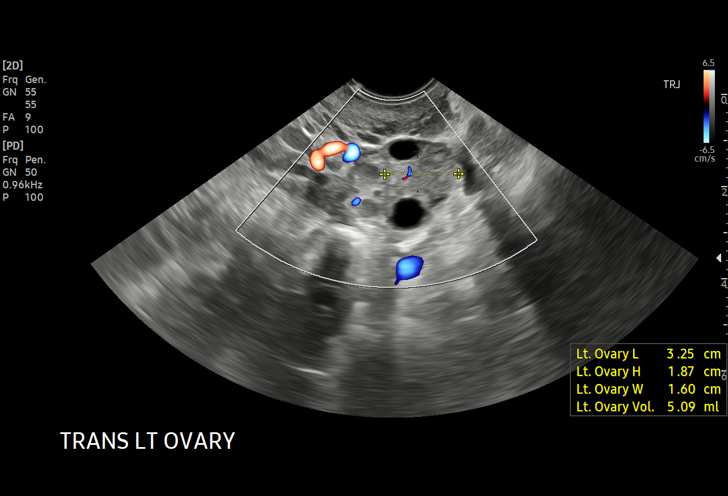

[15 of 25 positions shown; findings below may reference images not displayed]

FINDINGS: Uterus

Measurements: 10.6 x 5.2 x 7.5 cm = volume: 218 mL. Retroflexed.
Heterogeneous myometrium. Anterior wall transmural mass seen at
upper uterus 5.9 cm greatest size consistent with leiomyoma.
Additional 3.3 cm diameter subserosal mass anterior uterus.

Endometrium

Thickness: 7 mm. Small amount of nonspecific endometrial fluid. No
focal mass.

Right ovary

Measurements: 2.7 x 2.0 x 2.2 cm = volume: 6.1 mL. Normal morphology
without mass

Left ovary

Measurements: 3.3 x 1.9 x 1.6 cm = volume: 5.1 mL. Normal morphology
without mass

Other findings

No free pelvic fluid.  No adnexal masses.
IMPRESSION: Two uterine leiomyomata identified, largest 5.9 cm in greatest size
at anterior upper uterus which extends transmural to the submucosal
region.

Small amount of nonspecific endometrial fluid.

Unremarkable ovaries and adnexa.

## 2021-10-07 ENCOUNTER — Ambulatory Visit
Admission: RE | Admit: 2021-10-07 | Discharge: 2021-10-07 | Disposition: A | Discharge status: Home / Self Care | Payer: No Typology Code available for payment source | Source: Ambulatory Visit | Attending: Obstetrics and Gynecology | Admitting: Obstetrics and Gynecology

## 2021-10-07 ENCOUNTER — Ambulatory Visit: Payer: Self-pay | Admitting: *Deleted

## 2021-10-07 ENCOUNTER — Other Ambulatory Visit: Payer: Self-pay | Admitting: Obstetrics and Gynecology

## 2021-10-07 VITALS — BP 113/72 | Wt 119.3 lb

## 2021-10-07 DIAGNOSIS — Z1231 Encounter for screening mammogram for malignant neoplasm of breast: Secondary | ICD-10-CM

## 2021-10-07 DIAGNOSIS — Z1239 Encounter for other screening for malignant neoplasm of breast: Secondary | ICD-10-CM

## 2021-10-07 NOTE — Patient Instructions (Signed)
Explained breast self awareness with Jerral Ralph. Patient did not need a Pap smear today due to last Pap smear and HPV typing was 07/29/2020. Let her know her next Pap smear is due in 3 years based on her last Pap smear result. Referred patient to the [facility] for a screening mammogram on mobile unit. Appointment scheduled Thursday, October 07, 2021 at 1530. Patient aware of appointment and will be there. Let patient know the Breast Center will follow up with her within the next couple weeks with results of mammogram by letter or phone. Jerral Ralph verbalized understanding.  Amandajo Gonder, Arvil Chaco, RN 2:50 PM

## 2021-10-07 NOTE — Progress Notes (Signed)
Ms. Brandi Hurst is a 41 y.o. female who presents to [facility] clinic today with no complaints.    Pap Smear: Pap smear not completed today. Last Pap smear was 07/29/2020 at [facility] and was abnormal - ASCUS with negative HPV . Per patient has history of an abnormal Pap smear in 2013 that a colposcopy and LEEP were completed for follow-up. Last Pap smear result is available in Epic.   Physical exam: Breasts Breasts symmetrical. No skin abnormalities bilateral breasts. No nipple retraction bilateral breasts. No nipple discharge bilateral breasts. No lymphadenopathy. No lumps palpated bilateral breasts. No complaints of pain or tenderness on exam. Patient has bilateral breast implants.   MS DIGITAL SCREENING TOMO W/IMPLANTS BILAT  Result Date: 09/15/2020 CLINICAL DATA:  Screening. EXAM: DIGITAL SCREENING BILATERAL MAMMOGRAM WITH IMPLANTS, CAD AND TOMOSYNTHESIS TECHNIQUE: Bilateral screening digital craniocaudal and mediolateral oblique mammograms were obtained. Bilateral screening digital breast tomosynthesis was performed. The images were evaluated with computer-aided detection. Standard and/or implant displaced views were performed. COMPARISON:  None. ACR Breast Density Category c: The breast tissue is heterogeneously dense, which may obscure small masses. FINDINGS: The patient has retropectoral implants. There are no findings suspicious for malignancy. IMPRESSION: No mammographic evidence of malignancy. A result letter of this screening mammogram will be mailed directly to the patient. RECOMMENDATION: Screening mammogram in one year. (Code:SM-B-01Y) BI-RADS CATEGORY  1:  Negative. Electronically Signed   By: Lillia Mountain M.D.   On: 09/15/2020 13:44    Pelvic/Bimanual Pap is not indicated today per BCCCP guidelines.   Smoking History: Patient has never smoked.   Patient Navigation: Patient education provided. Access to services provided for patient through [place] program.  Spanish interpreter Rudene Anda from [facility] provided.    Breast and Cervical Cancer Risk Assessment: Patient does not have family history of breast cancer, known genetic mutations, or radiation treatment to the chest before age 39. Per patient has history of cervical dysplasia. Patient has no history of being immunocompromised or DES exposure in-utero.  Risk Assessment     Risk Scores       10/07/2021 09/10/2020   Last edited by: Demetrius Revel, LPN Royston Bake, CMA   5-year risk: 0.3 % 0.3 %   Lifetime risk: 5.8 % 5.8 %            A: BCCCP exam without pap smear No complaints.  P: Referred patient to the [facility] for a screening mammogram on mobile unit. Appointment scheduled Thursday, October 07, 2021 at 1530.  Loletta Parish, RN 10/07/2021 2:50 PM

## 2022-01-13 IMAGING — MG DIGITAL SCREENING BREAST BILAT IMPLANT W/ TOMO W/ CAD
9 of 14 series · 9 of 34 positions shown · non-contrast
Comparison: None.

CLINICAL DATA: Screening.

EXAM:
DIGITAL SCREENING BILATERAL MAMMOGRAM WITH IMPLANTS, CAD AND
TOMOSYNTHESIS
TECHNIQUE: Bilateral screening digital craniocaudal and mediolateral oblique
mammograms were obtained. Bilateral screening digital breast
tomosynthesis was performed. The images were evaluated with
computer-aided detection. Standard and/or implant displaced views
were performed.

[L CC]
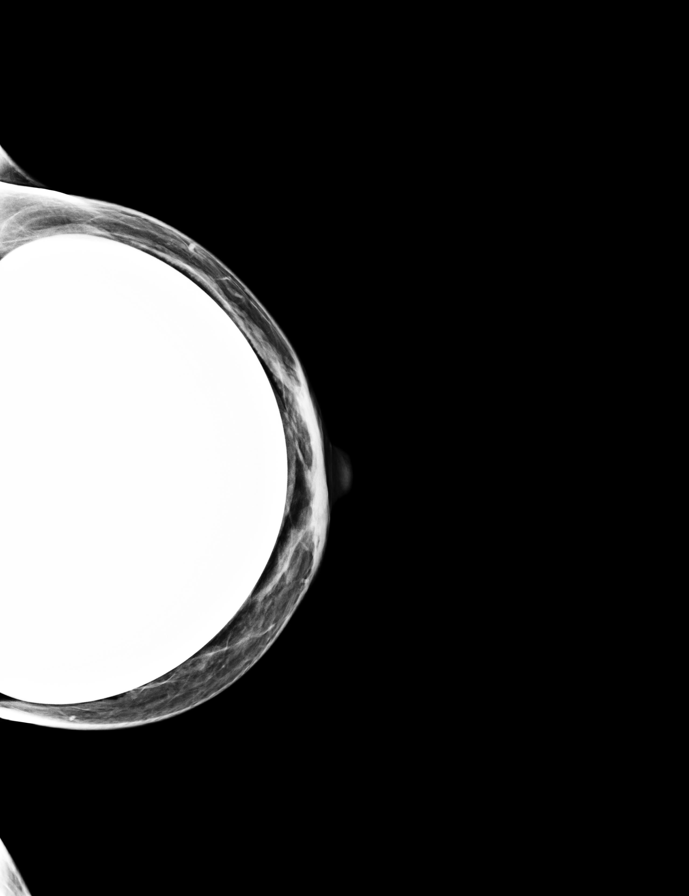

[R MLO]
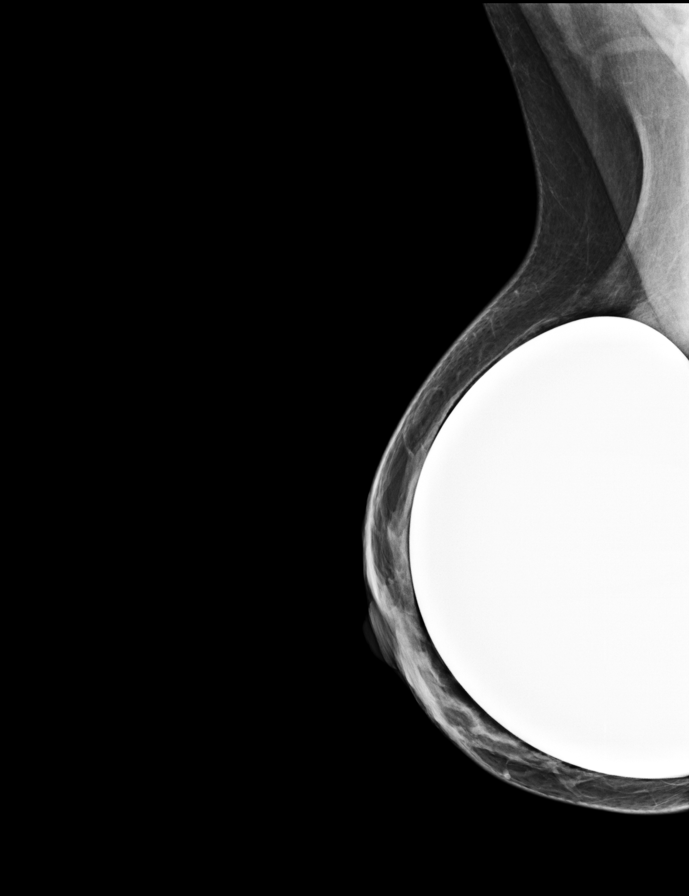

[L MLO]
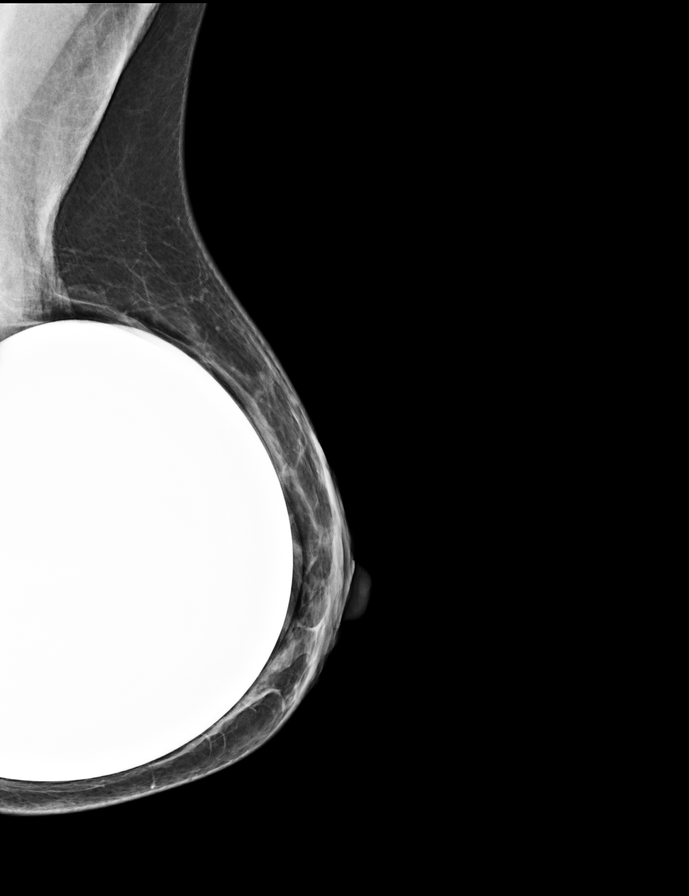

[R CC]
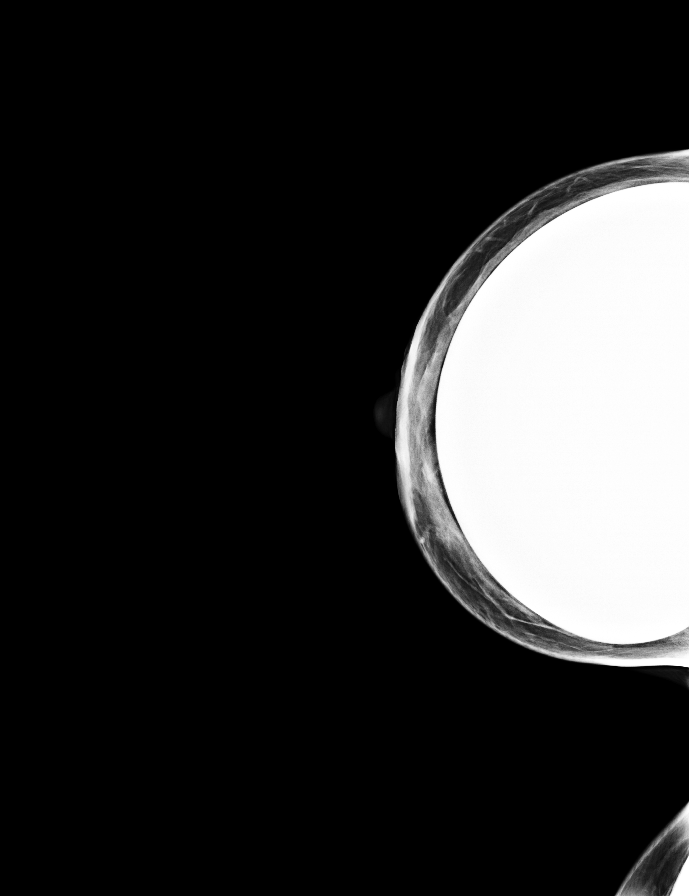

[R MLO synth-2D (1 of 2)]
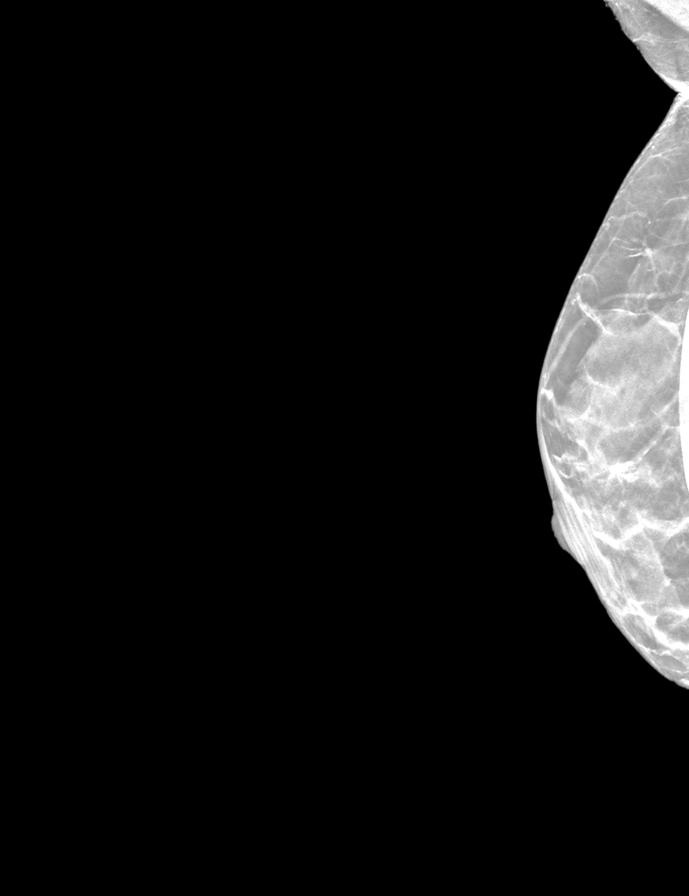

[L CC synth-2D]
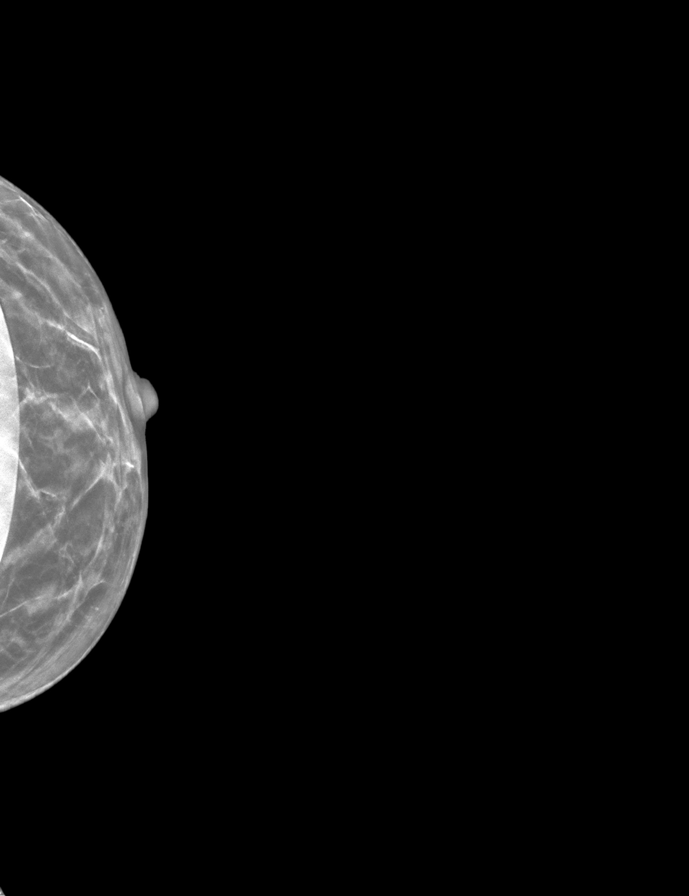

[R MLO synth-2D (2 of 2)]
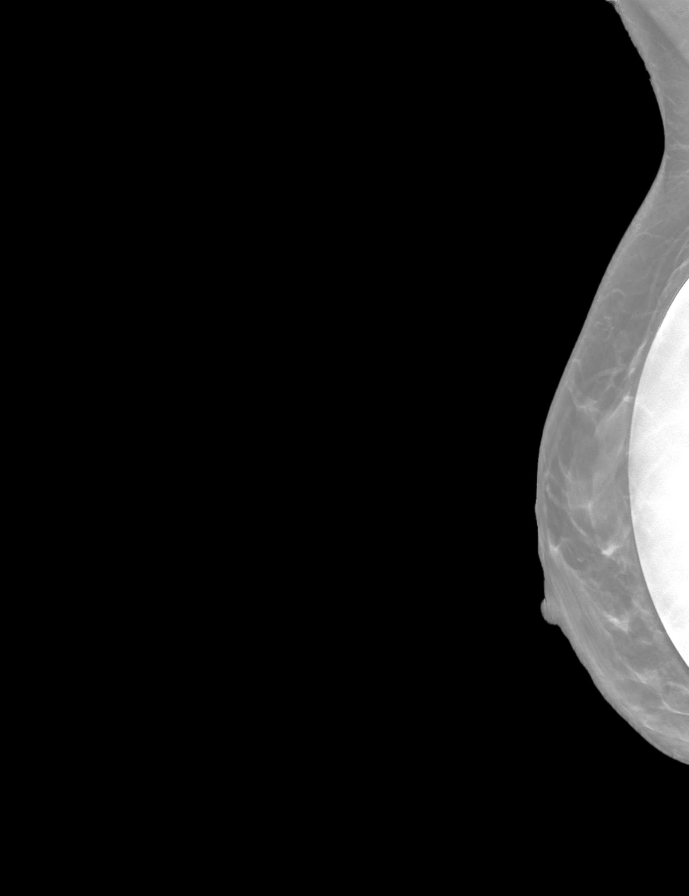

[R CC synth-2D]
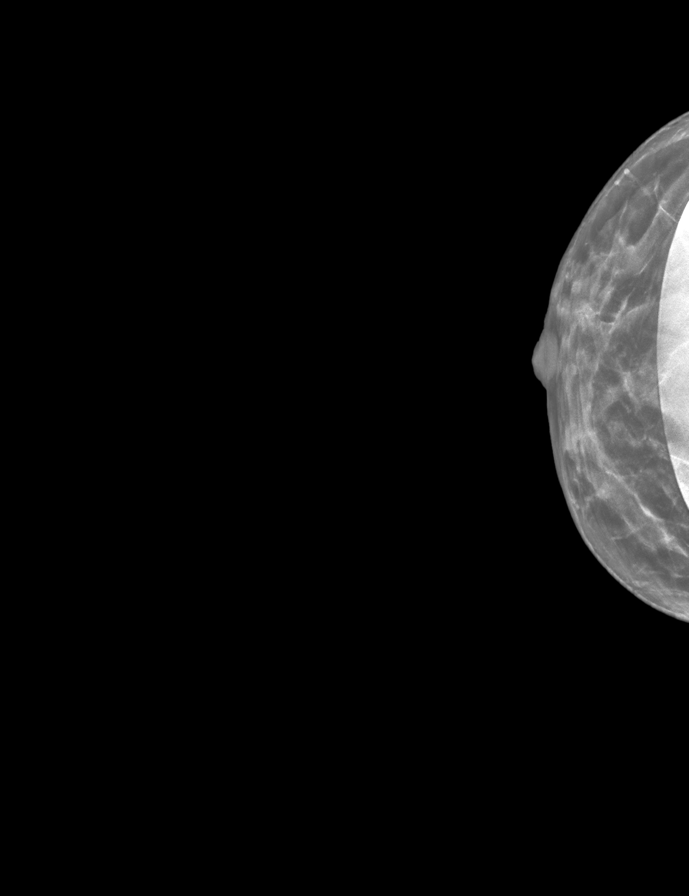

[L MLO synth-2D]
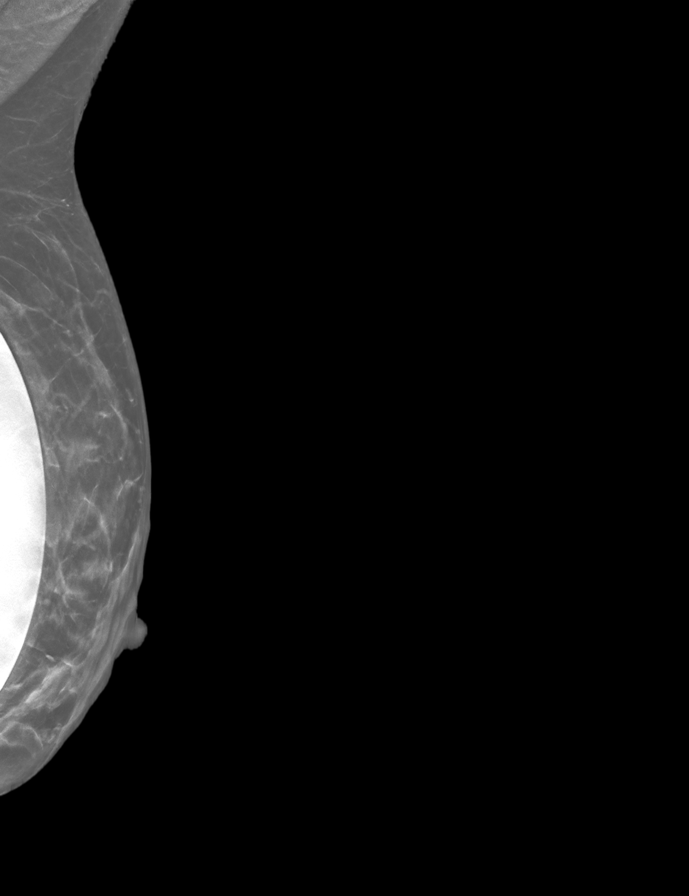

[9 of 34 positions shown; findings below may reference images not displayed]

ACR Breast Density Category c: The breast tissue is heterogeneously
dense, which may obscure small masses.
FINDINGS: The patient has retropectoral implants. There are no findings
suspicious for malignancy.
IMPRESSION: No mammographic evidence of malignancy. A result letter of this
screening mammogram will be mailed directly to the patient.

RECOMMENDATION:
Screening mammogram in one year. (Code:AY-6-W5O)

BI-RADS CATEGORY  1:  Negative.

## 2022-01-14 ENCOUNTER — Other Ambulatory Visit: Payer: Self-pay | Admitting: Obstetrics and Gynecology

## 2022-01-27 ENCOUNTER — Other Ambulatory Visit: Payer: Self-pay | Admitting: Pharmacist

## 2022-01-27 MED ORDER — NORGESTIMATE-ETH ESTRADIOL 0.25-35 MG-MCG PO TABS: 1.0000 | ORAL_TABLET | Freq: Every day | ORAL | 1 refills | Status: DC

## 2022-01-27 NOTE — Telephone Encounter (Signed)
Received prescription refill request for birth control. Sent in 6 months supply for patient. Called patient on telephone with help from in-person Spanish translator and verified with two patient identifiers. Let patient know she has a 6 month refill and will need to call the office to schedule and appointment for further refills.   Lolita Rieger, PharmD, BCPPS

## 2022-01-27 NOTE — Telephone Encounter (Signed)
Opened encounter in error  

## 2022-02-18 ENCOUNTER — Telehealth: Payer: Self-pay | Admitting: Lactation Services

## 2022-02-18 NOTE — Telephone Encounter (Signed)
Received refill request for Ibuprofen. Per Chart review, patient was due to be seen in the office in August 2023, however has not been seen in office since 06/11/2020.   Called patient with assistance of In house [place], [place]. Informed her that she will need to be seen in the office prior to prescribing again.   She has run out of birth control pills and is now experiencing more ovary pain. She took the OCP's last in December.   Informed her I will have front office call to schedule a follow up appointment.   Will ask Dr. Ilda Basset if can prescribe 3 months of birth control pills until patient can be scheduled for follow up appointment. Informed patient we will call  her back if prescription approved. Patient voiced understanding.

## 2022-02-22 MED ORDER — IBUPROFEN 600 MG PO TABS: ORAL_TABLET | ORAL | 2 refills | Status: AC

## 2022-02-22 MED ORDER — NORGESTIMATE-ETH ESTRADIOL 0.25-35 MG-MCG PO TABS: 1.0000 | ORAL_TABLET | Freq: Every day | ORAL | 0 refills | Status: DC

## 2022-02-22 NOTE — Telephone Encounter (Signed)
Called patient with assistance of Rosemarie Ax, in house Administrator, sports.   Patient informed that Dr. Ilda Basset approved 3 months of OCP's and Ibuprofen and will need follow up in office prior to continuing refills. Patient voiced understanding.   Patient asked why she needs to see the Doctor if the medications take away the pain, reviewed that it is important to follow up yearly to detect changes as needed.

## 2022-02-22 NOTE — Addendum Note (Signed)
Addended by: Donn Pierini on: 02/22/2022 09:09 AM   Modules accepted: Orders

## 2022-08-01 ENCOUNTER — Other Ambulatory Visit: Payer: Self-pay | Admitting: Obstetrics and Gynecology

## 2022-08-31 ENCOUNTER — Telehealth: Payer: Self-pay | Admitting: Family Medicine

## 2022-08-31 NOTE — Telephone Encounter (Signed)
PT. Called 2 days in a row 7/30 and 7/31 regarding her getting her BC pill refills, Informed patient she needs to be seen before getting anymore.. Today she had her daughter call on behalf of her for the pills again. Informed both daughter and mother that we are scheduled out until October and we will call her once the calendar is open.

## 2022-09-13 ENCOUNTER — Encounter: Payer: Self-pay | Admitting: Obstetrics and Gynecology

## 2022-09-13 DIAGNOSIS — Z1231 Encounter for screening mammogram for malignant neoplasm of breast: Secondary | ICD-10-CM

## 2022-09-22 ENCOUNTER — Other Ambulatory Visit: Payer: Self-pay | Admitting: Obstetrics and Gynecology

## 2022-09-22 DIAGNOSIS — Z1231 Encounter for screening mammogram for malignant neoplasm of breast: Secondary | ICD-10-CM

## 2022-11-03 ENCOUNTER — Ambulatory Visit: Payer: Self-pay | Admitting: Hematology and Oncology

## 2022-11-03 ENCOUNTER — Ambulatory Visit
Admission: RE | Admit: 2022-11-03 | Discharge: 2022-11-03 | Disposition: A | Discharge status: Home / Self Care | Payer: No Typology Code available for payment source | Source: Ambulatory Visit | Attending: Obstetrics and Gynecology | Admitting: Obstetrics and Gynecology

## 2022-11-03 VITALS — BP 124/76 | Wt 119.0 lb

## 2022-11-03 DIAGNOSIS — Z1231 Encounter for screening mammogram for malignant neoplasm of breast: Secondary | ICD-10-CM

## 2022-11-03 NOTE — Patient Instructions (Signed)
Taught Brandi Hurst about self breast awareness and gave educational materials to take home. Patient did not need a Pap smear today due to last Pap smear was in 07/29/2020 per patient. Let her know BCCCP will cover Pap smears every 5 years unless has a history of abnormal Pap smears. [facility] for screening mamm[facility]. Appointment scheduled for 11/03/2022. Patient aware of appointment and will be there. Let patient know will follow up with her within the next couple weeks with results. Brandi Hurst verbalized understanding.  Pascal Lux, NP 2:41 PM

## 2022-11-03 NOTE — Progress Notes (Signed)
Ms. Brandi Hurst is a 42 y.o. female who presents to [facility] clinic today with no complaints.    Pap Smear: Pap not smear completed today. Last Pap smear was 07/29/2020 and was abnormal - ASCUS/ HPV- . Per patient has no history of an abnormal Pap smear. Last Pap smear result is not available in Epic.   Physical exam: Breasts Breasts symmetrical. No skin abnormalities bilateral breasts. No nipple retraction bilateral breasts. No nipple discharge bilateral breasts. No lymphadenopathy. No lumps palpated bilateral breasts.    MS Digital Screening W/ Implants  Result Date: 10/11/2021 CLINICAL DATA:  Screening. EXAM: DIGITAL SCREENING BILATERAL MAMMOGRAM WITH IMPLANTS AND CAD TECHNIQUE: Bilateral screening digital craniocaudal and mediolateral oblique mammograms were obtained. The images were evaluated with computer-aided detection. Standard and/or implant displaced views were performed. COMPARISON:  Previous exam(s). ACR Breast Density Category b: There are scattered areas of fibroglandular density. FINDINGS: The patient has implants. There are no findings suspicious for malignancy. IMPRESSION: No mammographic evidence of malignancy. A result letter of this screening mammogram will be mailed directly to the patient. RECOMMENDATION: Screening mammogram in one year. (Code:SM-B-01Y) BI-RADS CATEGORY  1: Negative. Electronically Signed   By: Harmon Pier M.D.   On: 10/11/2021 08:37   MS DIGITAL SCREENING TOMO W/IMPLANTS BILAT  Result Date: 09/15/2020 CLINICAL DATA:  Screening. EXAM: DIGITAL SCREENING BILATERAL MAMMOGRAM WITH IMPLANTS, CAD AND TOMOSYNTHESIS TECHNIQUE: Bilateral screening digital craniocaudal and mediolateral oblique mammograms were obtained. Bilateral screening digital breast tomosynthesis was performed. The images were evaluated with computer-aided detection. Standard and/or implant displaced views were performed. COMPARISON:  None. ACR Breast Density Category c: The breast tissue is  heterogeneously dense, which may obscure small masses. FINDINGS: The patient has retropectoral implants. There are no findings suspicious for malignancy. IMPRESSION: No mammographic evidence of malignancy. A result letter of this screening mammogram will be mailed directly to the patient. RECOMMENDATION: Screening mammogram in one year. (Code:SM-B-01Y) BI-RADS CATEGORY  1:  Negative. Electronically Signed   By: Baird Lyons M.D.   On: 09/15/2020 13:44       Pelvic/Bimanual Pap is not indicated today    Smoking History: Patient has never smoked and was not referred to quit line.    Patient Navigation: Patient education provided. Access to services provided for patient through B[facility]CP program. Natale Lay interpreter provided. No transportation provided   Colorectal Cancer Screening: Per patient has never had colonoscopy completed No complaints today.    Breast and Cervical Cancer Risk Assessment: Patient does not have family history of breast cancer, known genetic mutations, or radiation treatment to the chest before age 42. Patient does not have history of cervical dysplasia, immunocompromised, or DES exposure in-utero.  Risk Scores as of Encounter on 11/03/2022     Brandi Hurst           5-year 0.54%   Lifetime 8.12%   This patient is [place] but has no documented birth country, so the [place] model used data from [place] patients to calculate their risk score. Document a birth country in the Demographics activity for a more accurate score.         Last calculated by Caprice Red, CMA on 11/03/2022 at  2:01 PM        A: BCCCP exam without pap smear No complaints with benign exam.   P: [facility] for a screening mammogram. Appointment scheduled 11/03/2022.  Pascal Lux, NP 11/03/2022 2:27 PM

## 2022-11-16 ENCOUNTER — Other Ambulatory Visit: Payer: Self-pay

## 2022-11-16 DIAGNOSIS — Z3041 Encounter for surveillance of contraceptive pills: Secondary | ICD-10-CM

## 2022-11-16 MED ORDER — NORGESTIMATE-ETH ESTRADIOL 0.25-35 MG-MCG PO TABS: 1.0000 | ORAL_TABLET | Freq: Every day | ORAL | 11 refills | Status: DC

## 2022-11-16 NOTE — Telephone Encounter (Signed)
Has not been seen by [facility] but is uninsured and followed by [employer]. She was seen recently with a normal blood pressure and no history of smoking. Will refill her OCP prescription.

## 2023-07-12 ENCOUNTER — Other Ambulatory Visit: Payer: Self-pay | Admitting: *Deleted

## 2023-07-12 DIAGNOSIS — Z124 Encounter for screening for malignant neoplasm of cervix: Secondary | ICD-10-CM

## 2023-07-12 NOTE — Progress Notes (Signed)
 Patient: Brandi Hurst           Date of Birth: [birth year]           MRN: [id number] Visit Date: 07/12/2023 PCP: Patient, No Pcp Per  Cervical Cancer Screening Do you smoke?: No Have you ever had or been told you have an allergy to latex products?: No Marital status: Single Date of last pap smear: 2-5 yrs ago Date of last menstrual period: 06/10/23 Number of pregnancies: 2 Number of births: 2 Have you ever had any of the following? Hysterectomy: No Tubal ligation (tubes tied): Yes Abnormal bleeding: No Abnormal pap smear: Yes Venereal warts: Yes A sex partner with venereal warts: No A high risk* sex partner: No  Cervical Exam  Abnormal Observations: Normal Exam Recommendations: Last Pap [facility]/29/[facility] and was abnormal - ASCUS with negative HPV . Per patient has history of an abnormal Pap smear in 2013 that a colposcopy and LEEP were completed for follow-up. Last Pap smear result is available in Epic. Let patient know if today's Pap smear is normal and HPV negative that her next Pap smear will be due in 3-5 years. Informed patient will follow up with her within the next couple of weeks with results of Pap smear by letter or phone. Patient verbalized understanding.    Spanish interpreter Herma Longest from [facility] provided.  Patient's History Patient Active Problem List   Diagnosis Date Noted   Fibroid uterus 06/11/2020   Uterine enlargement 04/20/2020   Dysmenorrhea 04/20/2020   Pelvic pain 04/20/2020   No past medical history on file.  Family History  Problem Relation Age of Onset   Hypertension Mother    Hypertension Father    Heart attack Father    Heart attack Brother    Heart disease Brother    Breast cancer Neg Hx     Social History   Occupational History   Not on file  Tobacco Use   Smoking status: Never   Smokeless tobacco: Never  Vaping Use   Vaping status: Never Used  Substance and Sexual Activity   Alcohol  use: Yes    Comment: occ   Drug use: Never   Sexual activity: Yes    Birth control/protection: Surgical, Pill

## 2023-07-12 NOTE — Patient Instructions (Signed)
 Brandi Hurst

## 2023-07-15 LAB — CYTOLOGY - PAP
Comment: NEGATIVE
Diagnosis: UNDETERMINED — AB
High risk HPV: NEGATIVE

## 2023-07-18 ENCOUNTER — Ambulatory Visit: Payer: Self-pay

## 2023-08-09 ENCOUNTER — Telehealth: Payer: Self-pay

## 2023-08-09 NOTE — Progress Notes (Signed)
 Patient presented at [facility] to get the results of her most recent pap smear. Informed patient that her pap showed ASCUS, -HPV. Based on this result and her previous hx recommendation is to repeat pap in 1 yr. Patient voiced understanding.

## 2023-08-10 NOTE — Telephone Encounter (Signed)
 Opened in error

## 2023-09-13 ENCOUNTER — Other Ambulatory Visit: Payer: Self-pay | Admitting: Certified Nurse Midwife

## 2023-09-13 DIAGNOSIS — Z3041 Encounter for surveillance of contraceptive pills: Secondary | ICD-10-CM

## 2023-10-08 ENCOUNTER — Other Ambulatory Visit: Payer: Self-pay | Admitting: Certified Nurse Midwife

## 2023-10-08 DIAGNOSIS — Z3041 Encounter for surveillance of contraceptive pills: Secondary | ICD-10-CM

## 2023-11-01 ENCOUNTER — Other Ambulatory Visit: Payer: Self-pay | Admitting: Certified Nurse Midwife

## 2023-11-01 DIAGNOSIS — Z3041 Encounter for surveillance of contraceptive pills: Secondary | ICD-10-CM

## 2023-11-26 ENCOUNTER — Other Ambulatory Visit: Payer: Self-pay | Admitting: Certified Nurse Midwife

## 2023-11-26 DIAGNOSIS — Z3041 Encounter for surveillance of contraceptive pills: Secondary | ICD-10-CM

## 2023-12-23 ENCOUNTER — Other Ambulatory Visit: Payer: Self-pay | Admitting: Certified Nurse Midwife

## 2023-12-23 DIAGNOSIS — Z3041 Encounter for surveillance of contraceptive pills: Secondary | ICD-10-CM

## 2023-12-25 ENCOUNTER — Ambulatory Visit: Payer: Self-pay

## 2024-01-02 ENCOUNTER — Other Ambulatory Visit: Payer: Self-pay | Admitting: Obstetrics and Gynecology

## 2024-01-02 DIAGNOSIS — Z1231 Encounter for screening mammogram for malignant neoplasm of breast: Secondary | ICD-10-CM

## 2024-03-28 ENCOUNTER — Encounter

## 2024-03-28 ENCOUNTER — Ambulatory Visit
